# Patient Record
Sex: Female | Born: 2000 | Race: Black or African American | Hispanic: No | Smoking: Never smoker
Health system: Southern US, Community
[De-identification: ages and names within clinical notes are randomized; demographics above are authoritative.]

---
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
## 2021-08-23 ENCOUNTER — Other Ambulatory Visit: Payer: Self-pay

## 2021-08-23 ENCOUNTER — Emergency Department (HOSPITAL_BASED_OUTPATIENT_CLINIC_OR_DEPARTMENT_OTHER)
Admission: EM | Admit: 2021-08-23 | Discharge: 2021-08-23 | Disposition: A | Discharge status: Home / Self Care | Payer: Medicaid Other | Attending: Emergency Medicine | Admitting: Emergency Medicine

## 2021-08-23 ENCOUNTER — Emergency Department (HOSPITAL_BASED_OUTPATIENT_CLINIC_OR_DEPARTMENT_OTHER): Payer: Medicaid Other

## 2021-08-23 ENCOUNTER — Encounter (HOSPITAL_BASED_OUTPATIENT_CLINIC_OR_DEPARTMENT_OTHER): Payer: Self-pay | Admitting: Emergency Medicine

## 2021-08-23 DIAGNOSIS — N760 Acute vaginitis: Secondary | ICD-10-CM | POA: Insufficient documentation

## 2021-08-23 DIAGNOSIS — K2100 Gastro-esophageal reflux disease with esophagitis, without bleeding: Secondary | ICD-10-CM | POA: Diagnosis not present

## 2021-08-23 DIAGNOSIS — R079 Chest pain, unspecified: Secondary | ICD-10-CM | POA: Diagnosis present

## 2021-08-23 LAB — URINALYSIS, ROUTINE W REFLEX MICROSCOPIC
Bilirubin Urine: NEGATIVE
Glucose, UA: NEGATIVE mg/dL
Hgb urine dipstick: NEGATIVE
Ketones, ur: NEGATIVE mg/dL
Leukocytes,Ua: NEGATIVE
Nitrite: NEGATIVE
Protein, ur: NEGATIVE mg/dL
Specific Gravity, Urine: 1.02 (ref 1.005–1.030)
pH: 6.5 (ref 5.0–8.0)

## 2021-08-23 LAB — COMPREHENSIVE METABOLIC PANEL
ALT: 34 U/L (ref 0–44)
AST: 19 U/L (ref 15–41)
Albumin: 4.2 g/dL (ref 3.5–5.0)
Alkaline Phosphatase: 88 U/L (ref 38–126)
Anion gap: 6 (ref 5–15)
BUN: 10 mg/dL (ref 6–20)
CO2: 27 mmol/L (ref 22–32)
Calcium: 9.7 mg/dL (ref 8.9–10.3)
Chloride: 105 mmol/L (ref 98–111)
Creatinine, Ser: 0.69 mg/dL (ref 0.44–1.00)
GFR, Estimated: >60 mL/min (ref >60)
Glucose, Bld: 90 mg/dL (ref 70–99)
Potassium: 3.9 mmol/L (ref 3.5–5.1)
Sodium: 138 mmol/L (ref 135–145)
Total Bilirubin: 0.3 mg/dL (ref 0.3–1.2)
Total Protein: 7.2 g/dL (ref 6.5–8.1)

## 2021-08-23 LAB — WET PREP, GENITAL
Clue Cells Wet Prep HPF POC: NONE SEEN
Sperm: NONE SEEN
Trich, Wet Prep: NONE SEEN
WBC, Wet Prep HPF POC: <10 (ref <10)
Yeast Wet Prep HPF POC: NONE SEEN

## 2021-08-23 LAB — LIPASE, BLOOD: Lipase: 30 U/L (ref 11–51)

## 2021-08-23 LAB — CBC WITH DIFFERENTIAL/PLATELET
Abs Immature Granulocytes: 0.01 10*3/uL (ref 0.00–0.07)
Basophils Absolute: 0 10*3/uL (ref 0.0–0.1)
Basophils Relative: 1 %
Eosinophils Absolute: 0.4 10*3/uL (ref 0.0–0.5)
Eosinophils Relative: 7 %
HCT: 40.9 % (ref 36.0–46.0)
Hemoglobin: 12.8 g/dL (ref 12.0–15.0)
Immature Granulocytes: 0 %
Lymphocytes Relative: 32 %
Lymphs Abs: 1.7 10*3/uL (ref 0.7–4.0)
MCH: 26.6 pg (ref 26.0–34.0)
MCHC: 31.3 g/dL (ref 30.0–36.0)
MCV: 84.9 fL (ref 80.0–100.0)
Monocytes Absolute: 0.5 10*3/uL (ref 0.1–1.0)
Monocytes Relative: 9 %
Neutro Abs: 2.8 10*3/uL (ref 1.7–7.7)
Neutrophils Relative %: 51 %
Platelets: 320 10*3/uL (ref 150–400)
RBC: 4.82 MIL/uL (ref 3.87–5.11)
RDW: 13.9 % (ref 11.5–15.5)
WBC: 5.4 10*3/uL (ref 4.0–10.5)
nRBC: 0 % (ref 0.0–0.2)

## 2021-08-23 LAB — PREGNANCY, URINE: Preg Test, Ur: NEGATIVE

## 2021-08-23 MED ORDER — OMEPRAZOLE 20 MG PO CPDR: 20.0000 mg | DELAYED_RELEASE_CAPSULE | Freq: Every day | ORAL | 0 refills | Status: AC

## 2021-08-23 MED ORDER — MICONAZOLE 3 200 MG VA SUPP: 200.0000 mg | Freq: Every day | VAGINAL | 0 refills | Status: AC

## 2021-08-23 NOTE — ED Provider Notes (Signed)
?[facility] EMERGENCY DEPARTMENT ?Provider Note ? ? ?CSN: [id number] ?Arrival date & time: 08/23/21  1145 ? ?  ? ?History ? ?Chief Complaint  ?Patient presents with  ? multiple complaints  ? ? ?Cheryl Galloway is a 21 y.o. female. ? ?Patient is a 21 year old female with no significant medical problems who is presenting today with multiple complaints.  Patient reports for the last few weeks every time she wakes up in the morning every other day at least she will have a deep burning pain sensation in the lower sternum that will usually start to improve if she drinks cold water.  Usually last about 30 minutes and then ends up resolving.  She does notice a scratchy strange sensation in her throat when she wakes up in the morning but does not taste an acid taste in her mouth.  She reports the symptoms were so bad yesterday she vomited x2.  However then it seems to go away and she seems to be okay the rest of the day.  She does not have significant shortness of breath, cough but has noticed the last few days she has had some congestion.  She has no history of asthma.  She does smoke marijuana but denies tobacco abuse or vaping.  She reports her symptoms are mostly in the morning she does not specifically notice it occurring after she eats.  She denies taking NSAID products or Goody's powder, BC powders or aspirin products. ?Secondly in the last 2 days she is noticed irritation in the external part of her vagina.  It burns when she urinates.  She denies any vaginal discharge itching or burning.  She denies using any new products in that area, rough sex or anything that she can think of would cause recurrent irritation.  She has been sexually active with the same partner for 4 years.  She does not use any protection.  Last menses has been within the last 30 days. ? ?The history is provided by the patient.  ? ?  ? ?Home Medications ?Prior to Admission medications   ?Medication Sig Start Date End Date Taking?  Authorizing Provider  ?miconazole (MICONAZOLE 3) 200 MG vaginal suppository Place 1 suppository (200 mg total) vaginally at bedtime. 08/23/21  Yes Gwyneth Sprout, MD  ?omeprazole (PRILOSEC) 20 MG capsule Take 1 capsule (20 mg total) by mouth daily. 08/23/21  Yes Gwyneth Sprout, MD  ?cetirizine (ZYRTEC) 10 MG tablet  01/09/20   [provider]  ?doxycycline (VIBRAMYCIN) 100 MG capsule Take 1 capsule (100 mg total) by mouth 2 (two) times daily. 12/20/20   Henderly, Britni A, PA-C  ?hydrocortisone cream 1 % Apply to affected area 2 times daily 12/20/20   Henderly, Britni A, PA-C  ?ibuprofen (ADVIL) 800 MG tablet Take 1 tablet (800 mg total) by mouth 3 (three) times daily. 07/21/20   Palumbo, April, MD  ?   ? ?Allergies    ?Patient has no known allergies.   ? ?Review of Systems   ?Review of Systems ? ?Physical Exam ?Updated Vital Signs ?BP 110/70   Pulse 61   Temp 98.1 ?F (36.7 ?C) (Oral)   Resp (!) 24   Ht 5\' 7"  (1.702 m)   Wt 127 kg   LMP 07/31/2021 (Exact Date)   SpO2 100%   BMI 43.85 kg/m?  ?Physical Exam ?Vitals and nursing note reviewed.  ?Constitutional:   ?   General: She is not in acute distress. ?   Appearance: She is well-developed.  ?HENT:  ?  Head: Normocephalic and atraumatic.  ?   Right Ear: Tympanic membrane normal.  ?   Left Ear: Tympanic membrane normal.  ?   Mouth/Throat:  ?   Mouth: Mucous membranes are moist.  ?Eyes:  ?   Pupils: Pupils are equal, round, and reactive to light.  ?Cardiovascular:  ?   Rate and Rhythm: Normal rate and regular rhythm.  ?   Heart sounds: Normal heart sounds. No murmur heard. ?  No friction rub.  ?Pulmonary:  ?   Effort: Pulmonary effort is normal.  ?   Breath sounds: Normal breath sounds. No wheezing or rales.  ?Abdominal:  ?   General: Bowel sounds are normal. There is no distension.  ?   Palpations: Abdomen is soft.  ?   Tenderness: There is no abdominal tenderness. There is no guarding or rebound.  ?Genitourinary: ? ? ?Musculoskeletal:     ?   General: No  tenderness. Normal range of motion.  ?   Right lower leg: No edema.  ?   Left lower leg: No edema.  ?   Comments: No edema  ?Skin: ?   General: Skin is warm and dry.  ?   Findings: No rash.  ?Neurological:  ?   Mental Status: She is alert and oriented to person, place, and time. Mental status is at baseline.  ?   Cranial Nerves: No cranial nerve deficit.  ?Psychiatric:     ?   Behavior: Behavior normal.  ? ? ?ED Results / Procedures / Treatments   ?Labs ?(all labs ordered are listed, but only abnormal results are displayed) ?Labs Reviewed  ?WET PREP, GENITAL  ?URINALYSIS, ROUTINE W REFLEX MICROSCOPIC  ?PREGNANCY, URINE  ?CBC WITH DIFFERENTIAL/PLATELET  ?COMPREHENSIVE METABOLIC PANEL  ?LIPASE, BLOOD  ?GC/CHLAMYDIA PROBE AMP ([place]) NOT AT [facility]  ? ? ?EKG ?EKG Interpretation ? ?Date/Time:  Friday August 23 2021 12:25:31 EDT ?Ventricular Rate:  71 ?PR Interval:  154 ?QRS Duration: 88 ?QT Interval:  365 ?QTC Calculation: 397 ?R Axis:   63 ?Text Interpretation: Sinus rhythm No previous tracing Confirmed by Gwyneth Sprout ([id number]) on 08/23/2021 12:49:23 PM ? ?Radiology ?DG Chest 2 View ? ?Result Date: 08/23/2021 ?CLINICAL DATA:  21 year old female with chest pain EXAM: CHEST - 2 VIEW COMPARISON:  03/16/2017 FINDINGS: Cardiomediastinal silhouette unchanged in size and contour. No evidence of central vascular congestion. No interlobular septal thickening. No pneumothorax or pleural effusion. No confluent airspace disease. No acute displaced fracture IMPRESSION: No active cardiopulmonary disease. Electronically Signed   By: Gilmer Mor D.O.   On: 08/23/2021 12:49   ? ?Procedures ?Procedures  ? ? ?Medications Ordered in ED ?Medications - No data to display ? ?ED Course/ Medical Decision Making/ A&P ?  ?                        ?Medical Decision Making ?Amount and/or Complexity of Data Reviewed ?Labs: ordered. ?Radiology: ordered. ? ? ?Patient is a 21 year old female presenting today with 2 separate complaints.  Initial  complaint with the chest pain which seems most consistent with GERD.  Patient does not have symptoms suggestive of acute respiratory cause or cardiac cause.  I independently interpreted patient's EKG and labs.  Patient's EKG shows no acute findings, CBC, lipase, CMP, UA, wet prep and UPT are all negative.  Low suspicion for PE and patient is PERC negative, no findings to suggest pericarditis or myocarditis.  We will treat for GERD with omeprazole and patient will  discontinue spicy foods and eating before bed.  She will follow-up if symptoms do not improve. ? ?Secondly patient will be given miconazole for her vaginal lesions.  Almost appears to be a lesion from irritation and rubbing.  Recommend she wear loosefitting clothing, avoid any sexual contact until symptoms resolve.  Low suspicion for STI at this time.  Lesions are not typical for syphilis and are not condylomas. ? ? ? ? ? ? ? ?Final Clinical Impression(s) / ED Diagnoses ?Final diagnoses:  ?Gastroesophageal reflux disease with esophagitis without hemorrhage  ?Acute vaginitis  ? ? ?Rx / DC Orders ?ED Discharge Orders   ? ?      Ordered  ?  miconazole (MICONAZOLE 3) 200 MG vaginal suppository  Daily at bedtime       ? 08/23/21 1401  ?  omeprazole (PRILOSEC) 20 MG capsule  Daily       ? 08/23/21 1401  ? ?  ?  ? ?  ? ? ?  ?Gwyneth SproutPlunkett, Eliazer Hemphill, MD ?08/23/21 1411 ? ?

## 2021-08-23 NOTE — Discharge Instructions (Signed)
You found to sore raw areas that are causing the pain.  Try the cream to see if that works.  If it starts to become worse you should follow-up with the health department or your OB/GYN for reevaluation.  Your swabs today look normal but cultures are still pending.  If cultures are positive someone will contact you.  Also all your blood work looks normal today.  We will treat for acid reflux and hopefully that will take away the symptoms you experience in the morning. ?

## 2021-08-23 NOTE — ED Triage Notes (Signed)
Intermittent central  sharp pain , shortness of breath and wheezing . Denies URI. ? ?Also requesting std test.  ? ?Adds "red" stools x 2 .  ?

## 2021-08-26 LAB — GC/CHLAMYDIA PROBE AMP (~~LOC~~) NOT AT ARMC
Chlamydia: NEGATIVE
Comment: NEGATIVE
Comment: NORMAL
Neisseria Gonorrhea: NEGATIVE

## 2021-11-07 ENCOUNTER — Other Ambulatory Visit: Payer: Self-pay

## 2021-11-07 ENCOUNTER — Encounter (HOSPITAL_BASED_OUTPATIENT_CLINIC_OR_DEPARTMENT_OTHER): Payer: Self-pay | Admitting: Pediatrics

## 2021-11-07 ENCOUNTER — Emergency Department (HOSPITAL_BASED_OUTPATIENT_CLINIC_OR_DEPARTMENT_OTHER)
Admission: EM | Admit: 2021-11-07 | Discharge: 2021-11-07 | Disposition: A | Discharge status: Home / Self Care | Payer: Medicaid Other | Attending: Emergency Medicine | Admitting: Emergency Medicine

## 2021-11-07 DIAGNOSIS — Z113 Encounter for screening for infections with a predominantly sexual mode of transmission: Secondary | ICD-10-CM | POA: Insufficient documentation

## 2021-11-07 DIAGNOSIS — Z79899 Other long term (current) drug therapy: Secondary | ICD-10-CM | POA: Insufficient documentation

## 2021-11-07 LAB — CBG MONITORING, ED: Glucose-Capillary: 79 mg/dL (ref 70–99)

## 2021-11-07 LAB — URINALYSIS, ROUTINE W REFLEX MICROSCOPIC
Bilirubin Urine: NEGATIVE
Glucose, UA: NEGATIVE mg/dL
Hgb urine dipstick: NEGATIVE
Ketones, ur: NEGATIVE mg/dL
Leukocytes,Ua: NEGATIVE
Nitrite: NEGATIVE
Protein, ur: NEGATIVE mg/dL
Specific Gravity, Urine: 1.02 (ref 1.005–1.030)
pH: 7 (ref 5.0–8.0)

## 2021-11-07 LAB — HIV ANTIBODY (ROUTINE TESTING W REFLEX): HIV Screen 4th Generation wRfx: NONREACTIVE

## 2021-11-07 LAB — PREGNANCY, URINE: Preg Test, Ur: NEGATIVE

## 2021-11-07 NOTE — ED Triage Notes (Signed)
Concern for STD and wants diabetes check;

## 2021-11-07 NOTE — Discharge Instructions (Signed)
You can follow-up on your results on your patient portal, your STI screening tests should result in the next 24 to 48 hours.

## 2021-11-08 LAB — RPR: RPR Ser Ql: NONREACTIVE

## 2021-11-09 LAB — HSV 1 ANTIBODY, IGG: HSV 1 Glycoprotein G Ab, IgG: 42.2 index — ABNORMAL HIGH (ref 0.00–0.90)

## 2021-11-11 LAB — HSV-2 IGG SUPPLEMENTAL TEST

## 2021-11-11 LAB — HSV 2 ANTIBODY, IGG: HSV 2 Glycoprotein G Ab, IgG: 1.07 index — ABNORMAL HIGH (ref 0.00–0.90)

## 2021-12-02 ENCOUNTER — Other Ambulatory Visit: Payer: Self-pay

## 2021-12-02 ENCOUNTER — Encounter (HOSPITAL_BASED_OUTPATIENT_CLINIC_OR_DEPARTMENT_OTHER): Payer: Self-pay | Admitting: Emergency Medicine

## 2021-12-02 ENCOUNTER — Emergency Department (HOSPITAL_BASED_OUTPATIENT_CLINIC_OR_DEPARTMENT_OTHER)
Admission: EM | Admit: 2021-12-02 | Discharge: 2021-12-02 | Disposition: A | Discharge status: Home / Self Care | Payer: Medicaid Other | Attending: Emergency Medicine | Admitting: Emergency Medicine

## 2021-12-02 DIAGNOSIS — J039 Acute tonsillitis, unspecified: Secondary | ICD-10-CM

## 2021-12-02 DIAGNOSIS — J029 Acute pharyngitis, unspecified: Secondary | ICD-10-CM | POA: Diagnosis present

## 2021-12-02 MED ORDER — CEPHALEXIN 500 MG PO CAPS: 500.0000 mg | ORAL_CAPSULE | Freq: Four times a day (QID) | ORAL | 0 refills | Status: AC

## 2021-12-02 MED ORDER — CEPHALEXIN 250 MG PO CAPS
500.0000 mg | ORAL_CAPSULE | Freq: Once | ORAL | Status: AC
Start: 2021-12-02 — End: 2021-12-02
  Administered 2021-12-02: 500 mg via ORAL
  Filled 2021-12-02: qty 2

## 2021-12-02 NOTE — ED Provider Notes (Signed)
[facility] EMERGENCY DEPARTMENT Provider Note   CSN: [id number] Arrival date & time: 12/02/21  0254     History  Chief Complaint  Patient presents with   Sore Throat    Cheryl Galloway is a 21 y.o. female.  Patient is a 21 year old female otherwise healthy presenting with a 2-day history of scratchy throat.  She looked in the mirror this evening and noticed white patches.  She denies having fevers at home.  She denies ill contacts.  Pain is worse when she swallows with no alleviating factors.  The history is provided by the patient.       Home Medications Prior to Admission medications   Medication Sig Start Date End Date Taking? Authorizing Provider  cetirizine (ZYRTEC) 10 MG tablet  01/09/20   [provider]  doxycycline (VIBRAMYCIN) 100 MG capsule Take 1 capsule (100 mg total) by mouth 2 (two) times daily. 12/20/20   Henderly, Britni A, PA-C  hydrocortisone cream 1 % Apply to affected area 2 times daily 12/20/20   Henderly, Britni A, PA-C  ibuprofen (ADVIL) 800 MG tablet Take 1 tablet (800 mg total) by mouth 3 (three) times daily. 07/21/20   Palumbo, April, MD  miconazole (MICONAZOLE 3) 200 MG vaginal suppository Place 1 suppository (200 mg total) vaginally at bedtime. 08/23/21   Gwyneth Sprout, MD  omeprazole (PRILOSEC) 20 MG capsule Take 1 capsule (20 mg total) by mouth daily. 08/23/21   Gwyneth Sprout, MD      Allergies    Patient has no known allergies.    Review of Systems   Review of Systems  All other systems reviewed and are negative.   Physical Exam Updated Vital Signs BP 120/78   Pulse 73   Temp 99.5 F (37.5 C) (Oral)   Resp 20   Ht 5\' 7"  (1.702 m)   Wt 131.1 kg   LMP 11/24/2021   SpO2 95%   BMI 45.26 kg/m  Physical Exam Vitals and nursing note reviewed.  Constitutional:      General: She is not in acute distress.    Appearance: She is well-developed. She is not diaphoretic.  HENT:     Head: Normocephalic and atraumatic.      Mouth/Throat:     Mouth: Mucous membranes are moist.     Tonsils: Tonsillar exudate present. No tonsillar abscesses.  Cardiovascular:     Rate and Rhythm: Normal rate and regular rhythm.     Heart sounds: No murmur heard.    No friction rub. No gallop.  Pulmonary:     Effort: Pulmonary effort is normal. No respiratory distress.     Breath sounds: Normal breath sounds. No wheezing.  Abdominal:     General: Bowel sounds are normal. There is no distension.     Palpations: Abdomen is soft.     Tenderness: There is no abdominal tenderness.  Musculoskeletal:        General: Normal range of motion.     Cervical back: Normal range of motion and neck supple.  Lymphadenopathy:     Cervical: No cervical adenopathy.  Skin:    General: Skin is warm and dry.  Neurological:     General: No focal deficit present.     Mental Status: She is alert and oriented to person, place, and time.     ED Results / Procedures / Treatments   Labs (all labs ordered are listed, but only abnormal results are displayed) Labs Reviewed - No data to display  EKG  None  Radiology No results found.  Procedures Procedures    Medications Ordered in ED Medications  cephALEXin (KEFLEX) capsule 500 mg (has no administration in time range)    ED Course/ Medical Decision Making/ A&P  Throat with bilateral exudates.  This will be treated with Keflex for tonsillitis.  Patient to follow-up as needed if not improving.  Final Clinical Impression(s) / ED Diagnoses Final diagnoses:  None    Rx / DC Orders ED Discharge Orders     None         Geoffery Lyons, MD 12/02/21 [phone]

## 2021-12-02 NOTE — ED Triage Notes (Addendum)
Red, enlarged, tonsils with white patches. Also mentions the L side of her face "feels different". Also has a small area of redness and warmth on the lateral side of her R calf.

## 2021-12-02 NOTE — Discharge Instructions (Signed)
Begin taking Keflex as prescribed.  Take ibuprofen 600 mg every 6 hours as needed for pain.  Follow-up with primary doctor if not improving in the next 3 to 4 days, and return to the ER if symptoms significantly worsen or change.

## 2022-06-10 ENCOUNTER — Emergency Department (HOSPITAL_BASED_OUTPATIENT_CLINIC_OR_DEPARTMENT_OTHER): Payer: 59

## 2022-06-10 ENCOUNTER — Other Ambulatory Visit: Payer: Self-pay

## 2022-06-10 ENCOUNTER — Encounter (HOSPITAL_BASED_OUTPATIENT_CLINIC_OR_DEPARTMENT_OTHER): Payer: Self-pay | Admitting: Emergency Medicine

## 2022-06-10 ENCOUNTER — Emergency Department (HOSPITAL_BASED_OUTPATIENT_CLINIC_OR_DEPARTMENT_OTHER)
Admission: EM | Admit: 2022-06-10 | Discharge: 2022-06-10 | Disposition: A | Discharge status: Home / Self Care | Payer: 59 | Attending: Emergency Medicine | Admitting: Emergency Medicine

## 2022-06-10 DIAGNOSIS — M25461 Effusion, right knee: Secondary | ICD-10-CM | POA: Diagnosis not present

## 2022-06-10 DIAGNOSIS — M79604 Pain in right leg: Secondary | ICD-10-CM | POA: Diagnosis present

## 2022-06-10 MED ORDER — IBUPROFEN 400 MG PO TABS
600.0000 mg | ORAL_TABLET | Freq: Once | ORAL | Status: AC
  Administered 2022-06-10: 600 mg via ORAL
  Filled 2022-06-10: qty 1
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
## 2022-06-10 NOTE — Discharge Instructions (Addendum)
Evaluation of your right knee reveals that you do have a small effusion which is a collection of fluid around the joint.  Nothing to do for that now.  Given that you are having some laxity and felt a pop and still having issues with walking and bearing weight on your right knee I do feel like you should follow-up with orthopedics.  I provided contact information in your discharge summary for local orthopedic doctor.
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
## 2022-06-27 ENCOUNTER — Encounter (HOSPITAL_COMMUNITY): Payer: Self-pay

## 2022-06-27 ENCOUNTER — Emergency Department (HOSPITAL_COMMUNITY): Payer: 59

## 2022-06-27 ENCOUNTER — Emergency Department (HOSPITAL_COMMUNITY)
Admission: EM | Admit: 2022-06-27 | Discharge: 2022-06-27 | Disposition: A | Discharge status: Home / Self Care | Payer: 59 | Attending: Emergency Medicine | Admitting: Emergency Medicine

## 2022-06-27 DIAGNOSIS — Y92410 Unspecified street and highway as the place of occurrence of the external cause: Secondary | ICD-10-CM | POA: Diagnosis not present

## 2022-06-27 DIAGNOSIS — S5002XA Contusion of left elbow, initial encounter: Secondary | ICD-10-CM | POA: Diagnosis not present

## 2022-06-27 DIAGNOSIS — M25561 Pain in right knee: Secondary | ICD-10-CM | POA: Insufficient documentation

## 2022-06-27 DIAGNOSIS — S59902A Unspecified injury of left elbow, initial encounter: Secondary | ICD-10-CM | POA: Diagnosis present

## 2022-06-27 LAB — PREGNANCY, URINE: Preg Test, Ur: NEGATIVE

## 2022-06-27 MED ORDER — METHOCARBAMOL 500 MG PO TABS
500.0000 mg | ORAL_TABLET | Freq: Once | ORAL | Status: AC
  Administered 2022-06-27: 500 mg via ORAL
  Filled 2022-06-27: qty 1

## 2022-06-27 MED ORDER — IBUPROFEN 800 MG PO TABS
800.0000 mg | ORAL_TABLET | Freq: Once | ORAL | Status: AC
  Administered 2022-06-27: 800 mg via ORAL
  Filled 2022-06-27: qty 1

## 2022-06-27 MED ORDER — METHOCARBAMOL 500 MG PO TABS: 500.0000 mg | ORAL_TABLET | Freq: Two times a day (BID) | ORAL | 0 refills | Status: AC | PRN
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
## 2022-06-27 NOTE — Progress Notes (Signed)
Orthopedic Tech Progress Note Patient Details:  Cheryl Galloway 04/27/2001 [id number]  Ortho Devices Type of Ortho Device: Knee Immobilizer Ortho Device/Splint Location: RLE Ortho Device/Splint Interventions: Ordered, Adjustment, Application   Post Interventions Patient Tolerated: Well Instructions Provided: Poper ambulation with device, Care of device, Adjustment of device  Cheryl Galloway 06/27/2022, 7:05 PM
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
## 2022-07-09 ENCOUNTER — Ambulatory Visit: Payer: 59 | Attending: Orthopedic Surgery | Admitting: Physical Therapy

## 2022-07-09 DIAGNOSIS — M6281 Muscle weakness (generalized): Secondary | ICD-10-CM | POA: Insufficient documentation

## 2022-07-09 DIAGNOSIS — M25561 Pain in right knee: Secondary | ICD-10-CM | POA: Diagnosis present

## 2022-07-09 DIAGNOSIS — R600 Localized edema: Secondary | ICD-10-CM | POA: Insufficient documentation
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
## 2022-07-18 ENCOUNTER — Ambulatory Visit: Payer: 59 | Admitting: Physical Therapy

## 2022-07-23 ENCOUNTER — Ambulatory Visit: Payer: 59 | Attending: Orthopedic Surgery | Admitting: Physical Therapy

## 2022-07-23 DIAGNOSIS — M25561 Pain in right knee: Secondary | ICD-10-CM | POA: Diagnosis present

## 2022-07-23 DIAGNOSIS — M6281 Muscle weakness (generalized): Secondary | ICD-10-CM | POA: Diagnosis present

## 2022-07-23 DIAGNOSIS — R600 Localized edema: Secondary | ICD-10-CM | POA: Insufficient documentation
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
## 2022-07-25 ENCOUNTER — Telehealth: Payer: Self-pay | Admitting: Physical Therapy

## 2022-07-25 ENCOUNTER — Ambulatory Visit: Payer: 59 | Admitting: Physical Therapy

## 2022-07-25 ENCOUNTER — Encounter: Payer: Self-pay | Admitting: Physical Therapy

## 2022-07-25 NOTE — Telephone Encounter (Signed)
Patient missed her PT appt this AM.  Called to remind her of the attendance policy and told her she could reschedule today's appt if she was able.   Left voicemail.   Raeford Razor, PT 07/25/22 11:26 AM Phone: [phone] Fax: [phone]
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
## 2022-07-30 ENCOUNTER — Ambulatory Visit: Payer: 59 | Admitting: Physical Therapy

## 2022-07-30 ENCOUNTER — Telehealth: Payer: Self-pay | Admitting: Physical Therapy

## 2022-07-30 NOTE — Telephone Encounter (Signed)
Patient did not show for her appt today. Unfortunately, her step-father passed away last week.  She would like to go on hold for a couple of weeks in order for her to deal with this personal matter. Her remaining visits will be cancelled at this time.  Raeford Razor, PT 07/30/22 1:55 PM Phone: [phone] Fax: [phone]

## 2022-08-01 ENCOUNTER — Ambulatory Visit: Payer: 59 | Admitting: Physical Therapy

## 2022-08-06 ENCOUNTER — Ambulatory Visit: Payer: 59 | Admitting: Physical Therapy

## 2022-08-08 ENCOUNTER — Encounter: Payer: Medicaid Other | Admitting: Physical Therapy

## 2022-08-13 ENCOUNTER — Ambulatory Visit: Payer: 59 | Admitting: Physical Therapy

## 2023-01-24 IMAGING — CR DG CHEST 2V
2 series · 2 of 2 positions shown · non-contrast
Comparison: 03/16/2017

CLINICAL DATA: 20-year-old female with chest pain

EXAM:
CHEST - 2 VIEW

[w chest pa]
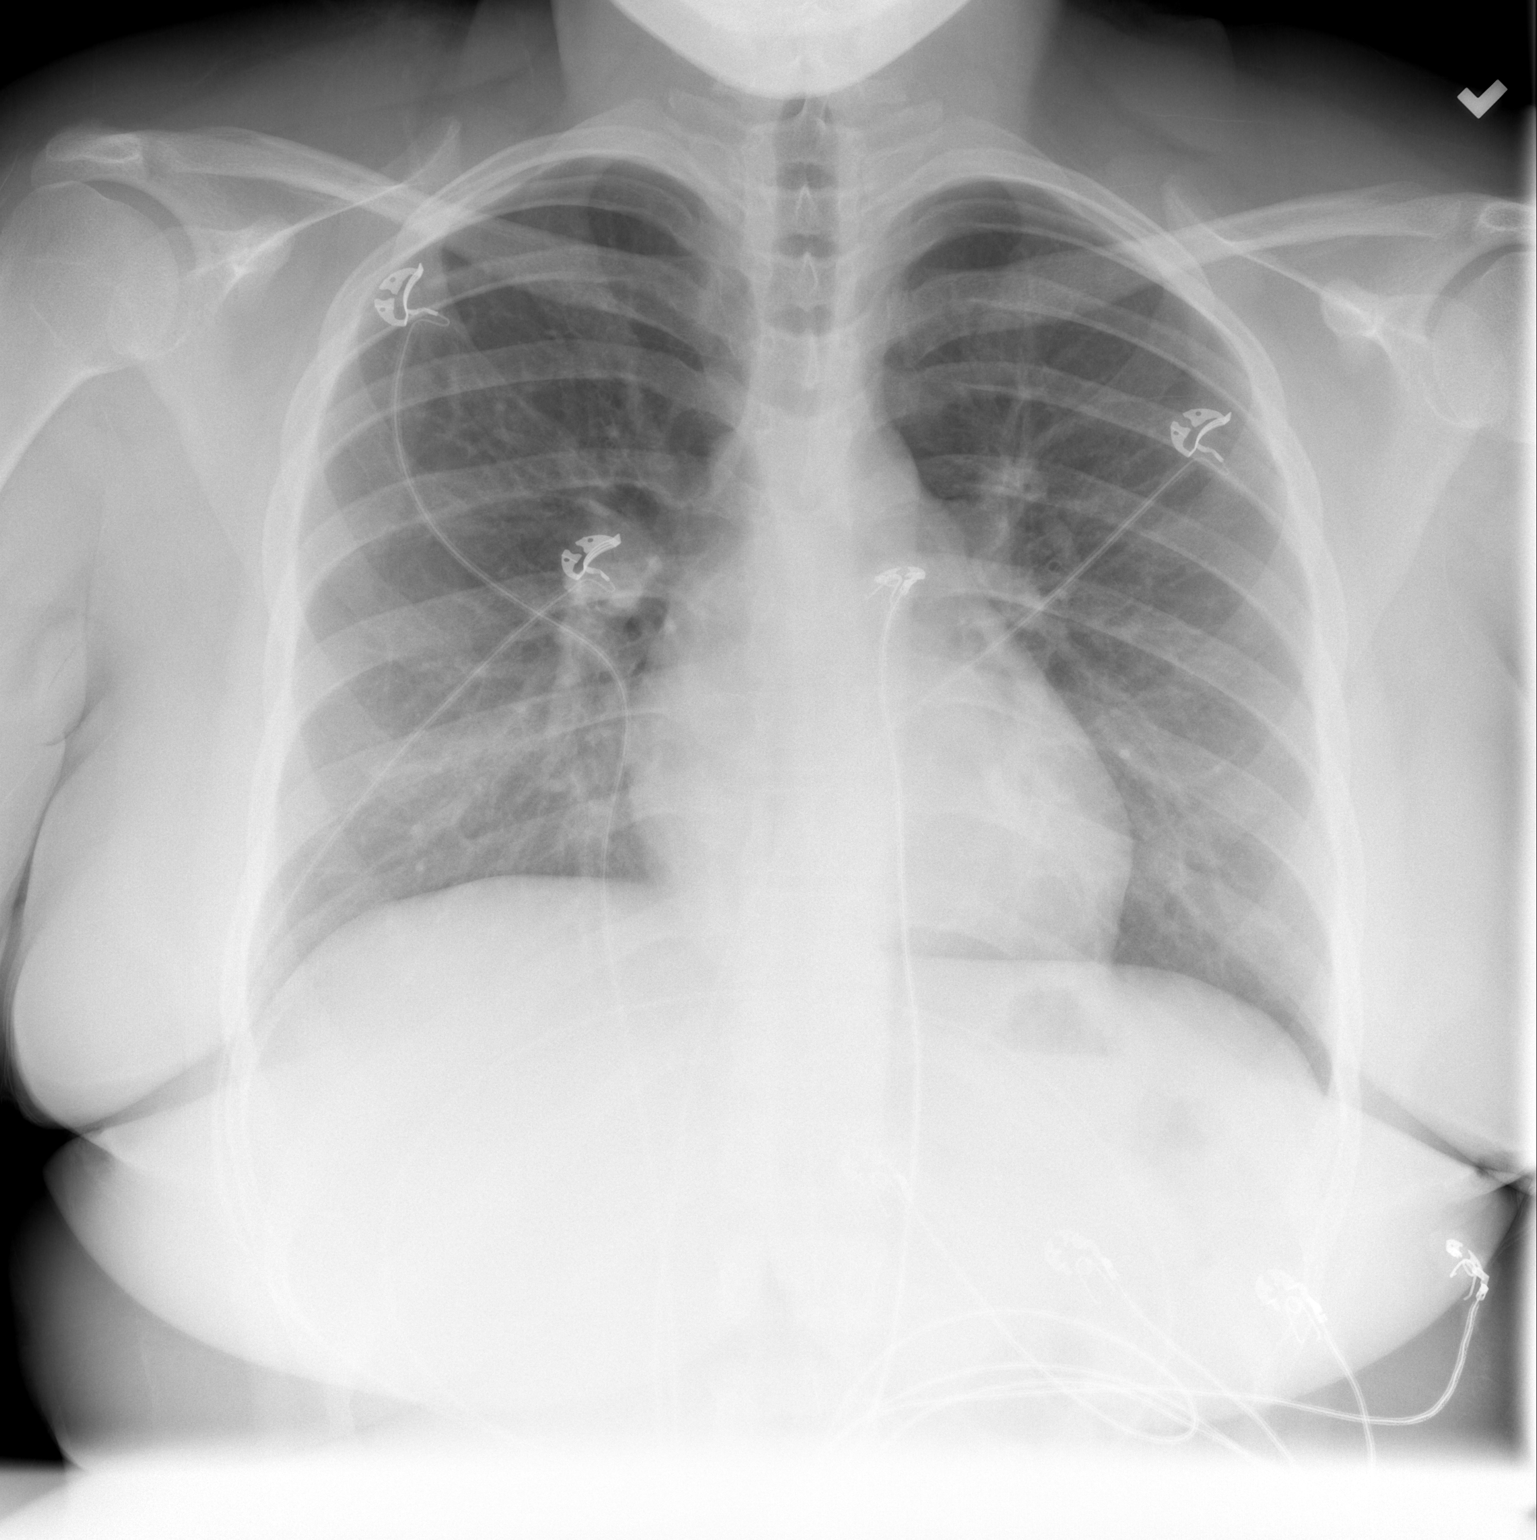

[w chest lat]
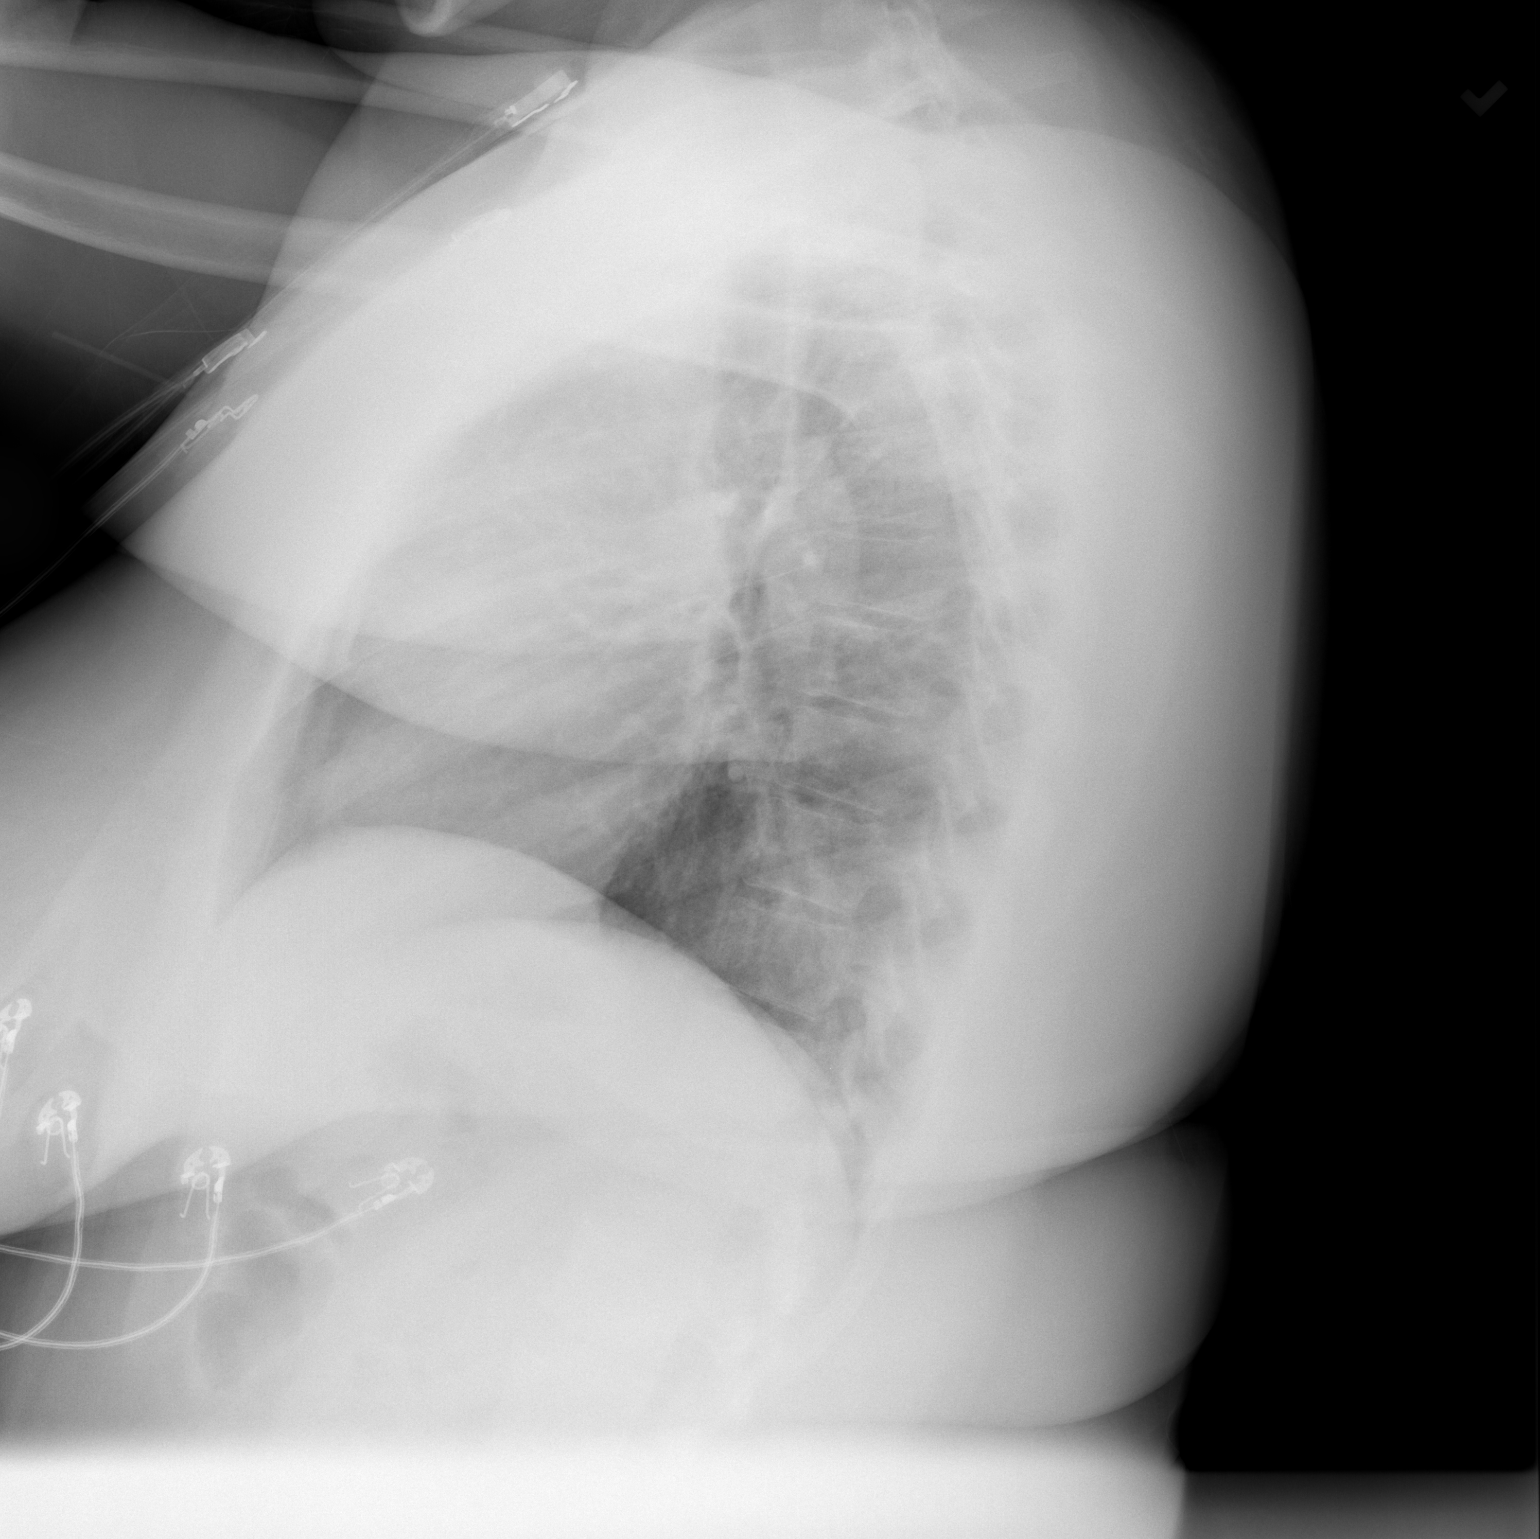

[2 of 2 positions shown; findings below may reference images not displayed]

FINDINGS: Cardiomediastinal silhouette unchanged in size and contour. No
evidence of central vascular congestion. No interlobular septal
thickening. No pneumothorax or pleural effusion. No confluent
airspace disease.

No acute displaced fracture
IMPRESSION: No active cardiopulmonary disease.
